# Patient Record
Sex: Male | Born: 2000 | Race: Black or African American | Hispanic: No | Marital: Single
Health system: Southern US, Community
[De-identification: ages and names within clinical notes are randomized; demographics above are authoritative.]

## PROBLEM LIST (undated history)

## (undated) ENCOUNTER — Emergency Department (HOSPITAL_COMMUNITY): Payer: Self-pay | Source: Home / Self Care

---
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
## 2020-01-27 ENCOUNTER — Emergency Department (HOSPITAL_COMMUNITY)
Admission: EM | Admit: 2020-01-27 | Discharge: 2020-01-27 | Disposition: A | Discharge status: Home / Self Care | Payer: Medicaid Other | Attending: Emergency Medicine | Admitting: Emergency Medicine

## 2020-01-27 ENCOUNTER — Other Ambulatory Visit: Payer: Self-pay

## 2020-01-27 DIAGNOSIS — Z5321 Procedure and treatment not carried out due to patient leaving prior to being seen by health care provider: Secondary | ICD-10-CM | POA: Insufficient documentation

## 2020-01-27 DIAGNOSIS — M79621 Pain in right upper arm: Secondary | ICD-10-CM | POA: Diagnosis not present

## 2020-01-28 ENCOUNTER — Other Ambulatory Visit: Payer: Self-pay

## 2020-01-28 ENCOUNTER — Emergency Department (HOSPITAL_COMMUNITY)
Admission: EM | Admit: 2020-01-28 | Discharge: 2020-01-28 | Disposition: A | Discharge status: Home / Self Care | Payer: Medicaid Other | Attending: Emergency Medicine | Admitting: Emergency Medicine

## 2020-01-28 ENCOUNTER — Encounter (HOSPITAL_COMMUNITY): Payer: Self-pay | Admitting: Emergency Medicine

## 2020-01-28 ENCOUNTER — Telehealth: Payer: Self-pay | Admitting: *Deleted

## 2020-01-28 DIAGNOSIS — Z7722 Contact with and (suspected) exposure to environmental tobacco smoke (acute) (chronic): Secondary | ICD-10-CM | POA: Diagnosis not present

## 2020-01-28 DIAGNOSIS — L0291 Cutaneous abscess, unspecified: Secondary | ICD-10-CM

## 2020-01-28 DIAGNOSIS — L02415 Cutaneous abscess of right lower limb: Secondary | ICD-10-CM | POA: Diagnosis not present

## 2020-01-28 DIAGNOSIS — L02413 Cutaneous abscess of right upper limb: Secondary | ICD-10-CM | POA: Insufficient documentation

## 2020-01-28 MED ORDER — LIDOCAINE-EPINEPHRINE (PF) 2 %-1:200000 IJ SOLN
INTRAMUSCULAR | Status: AC
  Administered 2020-01-28: 10 mL
  Filled 2020-01-28: qty 20

## 2020-01-28 MED ORDER — LIDOCAINE-EPINEPHRINE (PF) 2 %-1:200000 IJ SOLN
10.0000 mL | Freq: Once | INTRAMUSCULAR | Status: AC
  Filled 2020-01-28: qty 10

## 2020-01-28 MED ORDER — DOXYCYCLINE HYCLATE 100 MG PO CAPS
100.0000 mg | ORAL_CAPSULE | Freq: Two times a day (BID) | ORAL | 0 refills | Status: AC
Start: 2020-01-28 — End: ?

## 2020-01-28 MED ORDER — POVIDONE-IODINE 5 % EX SOLN
Freq: Once | CUTANEOUS | Status: AC
  Filled 2020-01-28: qty 88.7

## 2020-01-28 NOTE — Telephone Encounter (Signed)
Medicaid Managed Care team Transition of Care Assessment outreach attempt #1 made today. Unable to reach patient. HIPPA compliant voice message left requesting a return call. The patient has also been enrolled in an automated discharge follow up call series and will receive two outreach attempts for transition of care assessment. Contact information has been left for the patient and the [facility] Managed Care team is available to provide assistance to the patient at any time. .  Left message with pt's mother; she will have him call back when he wakes up.  Burnard Bunting, RN, BSN, CCRN Patient Engagement Center [phone]

## 2020-01-28 NOTE — ED Triage Notes (Signed)
Pt c/o abscess to right a/c x one week.

## 2020-01-28 NOTE — Discharge Instructions (Signed)
Take the antibiotics as prescribed perform the warm soaks at least twice daily.  Follow-up with your doctor in 2 days for recheck.  Return to the ED sooner with fever, spreading redness, worsening pain or other concerns.

## 2020-01-28 NOTE — ED Provider Notes (Signed)
[facility] EMERGENCY DEPARTMENT Provider Note   CSN: [id number] Arrival date & time: 01/28/20  0124     History Chief Complaint  Patient presents with  . Abscess    Joseph Brooks. is a 19 y.o. male.  Patient complains of pain and swelling to small bump at the right [facility] area on his right arm for the past 2 or 3 days.  States he had a dark spot there ever since he gave blood several months ago.  Over the past 3 to 4 days he noticed increasing pain and swelling to the same area.  No fevers, chills, nausea or vomiting.  No focal weakness, numbness or tingling.  No chest pain or shortness of breath. Denies any IV drug abuse. Denies any history of immunocompromise state or HIV.  The history is provided by the patient.  Abscess Associated symptoms: no fever, no headaches, no nausea and no vomiting        History reviewed. No pertinent past medical history.  Patient Active Problem List   Diagnosis Date Noted  . 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 CLOSED FRACTURE DISTAL PHALANX OR PHALANGES HAND 10/20/2007    History reviewed. No pertinent surgical history.     No family history on file.  Social History   Tobacco Use  . Smoking status: Passive Smoke Exposure - Never Smoker  . Smokeless tobacco: Never Used  Substance Use Topics  . Alcohol use: Never  . Drug use: Never    Home Medications Prior to Admission medications   Not on File    Allergies    Patient has no known allergies.  Review of Systems   Review of Systems  Constitutional: Negative for activity change, appetite change and fever.  HENT: Negative for congestion and rhinorrhea.   Respiratory: Negative for cough, chest tightness and shortness of breath.   Cardiovascular: Negative for chest pain.  Gastrointestinal: Negative for abdominal distention, nausea and vomiting.  Genitourinary: Negative for dysuria and hematuria.  Skin: Positive for wound.  Neurological: Negative for dizziness,  weakness, light-headedness and headaches.   all other systems are negative except as noted in the HPI and PMH.    Physical Exam Updated Vital Signs BP (!) 129/80   Pulse 62   Temp 98.3 F (36.8 C) (Oral)   Resp 18   Ht 6\' 1"  (1.854 m)   Wt 66.7 kg   SpO2 100%   BMI 19.39 kg/m   Physical Exam Vitals and nursing note reviewed.  Constitutional:      General: He is not in acute distress.    Appearance: He is well-developed.  HENT:     Head: Normocephalic and atraumatic.     Mouth/Throat:     Pharynx: No oropharyngeal exudate.  Eyes:     Conjunctiva/sclera: Conjunctivae normal.     Pupils: Pupils are equal, round, and reactive to light.  Neck:     Comments: No meningismus. Cardiovascular:     Rate and Rhythm: Normal rate and regular rhythm.     Heart sounds: Normal heart sounds. No murmur heard.   Pulmonary:     Effort: Pulmonary effort is normal. No respiratory distress.     Breath sounds: Normal breath sounds.  Abdominal:     Palpations: Abdomen is soft.     Tenderness: There is no abdominal tenderness. There is no guarding or rebound.  Musculoskeletal:        General: No tenderness. Normal range of motion.     Cervical back: Normal range of  motion and neck supple.     Comments: 1 cm pustule with right AC fossa no surrounding erythema.  Skin:    General: Skin is warm.  Neurological:     Mental Status: He is alert and oriented to person, place, and time.     Cranial Nerves: No cranial nerve deficit.     Motor: No abnormal muscle tone.     Coordination: Coordination normal.     Comments: No ataxia on finger to nose bilaterally. No pronator drift. 5/5 strength throughout. CN 2-12 intact.Equal grip strength. Sensation intact.   Psychiatric:        Behavior: Behavior normal.     ED Results / Procedures / Treatments   Labs (all labs ordered are listed, but only abnormal results are displayed) Labs Reviewed - No data to display  EKG None  Radiology No results  found.  Procedures .Marland KitchenIncision and Drainage  Date/Time: 01/28/2020 6:57 AM Performed by: Glynn Octave, MD Authorized by: Glynn Octave, MD   Consent:    Consent obtained:  Verbal   Consent given by:  Patient   Risks discussed:  Bleeding, damage to other organs, infection, incomplete drainage and pain Location:    Type:  Abscess   Size:  1   Location:  Upper extremity   Upper extremity location:  Arm   Arm location:  R lower arm Pre-procedure details:    Skin preparation:  Betadine Anesthesia (see MAR for exact dosages):    Anesthesia method:  Local infiltration   Local anesthetic:  Lidocaine 2% WITH epi Procedure type:    Complexity:  Simple Procedure details:    Needle aspiration: yes     Incision types:  Single straight   Incision depth:  Subcutaneous   Scalpel blade:  11   Wound management:  Probed and deloculated, irrigated with saline and extensive cleaning   Drainage:  Purulent   Drainage amount:  Moderate   Wound treatment:  Wound left open   Packing materials:  None Post-procedure details:    Patient tolerance of procedure:  Tolerated well, no immediate complications   (including critical care time)  Medications Ordered in ED Medications  lidocaine-EPINEPHrine (XYLOCAINE W/EPI) 2 %-1:200000 (PF) injection 10 mL (has no administration in time range)  povidone-Iodine (BETADINE) 5 % topical solution (has no administration in time range)    ED Course  I have reviewed the triage vital signs and the nursing notes.  Pertinent labs & imaging results that were available during my care of the patient were reviewed by me and considered in my medical decision making (see chart for details).    MDM Rules/Calculators/A&P                         Possible abscess to the right AC area.  Denies IV drug abuse.  Warm compresses antibiotics versus incision and drainage discussed with patient.  Agrees to proceed with incision and drainage.  Incision and drainage as  above.  Purulence expressed.  Wound left open.  Patient instructed on warm soaks, antibiotics and the wound check in 2 days.  Follow-up with PCP.  Return to the ED sooner with worsening pain, redness, fever, other concerns. Final Clinical Impression(s) / ED Diagnoses Final diagnoses:  Abscess    Rx / DC Orders ED Discharge Orders         Ordered    doxycycline (VIBRAMYCIN) 100 MG capsule  2 times daily     Discontinue  Reprint  01/28/20 7062           Glynn Octave, MD 01/28/20 [phone]

## 2020-01-29 ENCOUNTER — Telehealth: Payer: Self-pay | Admitting: *Deleted

## 2020-01-29 NOTE — Telephone Encounter (Signed)
Contacted pt to complete transition of care assessment:  Transition Care Management Follow-up Telephone Call  . Medicaid Managed Care Transition Call Status:MM [facility] Call Made  . Date of discharge and from where: [facility], 01/28/20  . How have you been since you were released from the hospital? "ok"  . Any questions or concerns? Did not get gauze to change dressing  Items Reviewed: Marland Kitchen Did the pt receive and understand the discharge instructions provided? Yes  . Medications obtained and verified? Yes  . Any new allergies since your discharge? No  . Dietary orders reviewed? N/a . Do you have support at home? Yes, family   Functional Questionnaire: (I = Independent and D = Dependent)  ADLs: Independent Bathing/Dressing:Independent Meal Prep: Independent Eating: Independent Maintaining continence: Independent Transferring/Ambulation: Independent Managing Meds: Independent   Follow up appointments reviewed:  PCP Hospital f/u appt confirmed? No pt does not have a PCP   Specialist Hospital f/u appt confirmed? N/a  Are transportation arrangements needed? No   If their condition worsens, is the pt aware to call PCP or go to the EmergencyDept.? Yes  Was the patient provided with contact information for the PCP's office or ED? yes  Was to pt encouraged to call back with questions or concerns? yes  The pt would like assistance in finding PCP; explained [employer] agent will contact pt regarding this. He verbalized understanding.  Burnard Bunting, RN, BSN, CCRN Patient Engagement Center [phone]

## 2020-02-02 ENCOUNTER — Telehealth: Payer: Self-pay | Admitting: *Deleted

## 2020-02-02 NOTE — Telephone Encounter (Signed)
Call received from [facility] in response to [facility] general discharge transition call. Patient has agreed to make follow up appointment with PCP today.

## 2023-11-11 DIAGNOSIS — Z419 Encounter for procedure for purposes other than remedying health state, unspecified: Secondary | ICD-10-CM | POA: Diagnosis not present

## 2023-12-12 DIAGNOSIS — Z419 Encounter for procedure for purposes other than remedying health state, unspecified: Secondary | ICD-10-CM | POA: Diagnosis not present

## 2024-01-11 DIAGNOSIS — Z419 Encounter for procedure for purposes other than remedying health state, unspecified: Secondary | ICD-10-CM | POA: Diagnosis not present

## 2024-02-11 DIAGNOSIS — Z419 Encounter for procedure for purposes other than remedying health state, unspecified: Secondary | ICD-10-CM | POA: Diagnosis not present

## 2024-03-13 DIAGNOSIS — Z419 Encounter for procedure for purposes other than remedying health state, unspecified: Secondary | ICD-10-CM | POA: Diagnosis not present
